# Patient Record
Sex: Female | Born: 1975 | Race: White | Hispanic: No | Marital: Single | Smoking: Never smoker
Health system: Southern US, Community
[De-identification: ages and names within clinical notes are randomized; demographics above are authoritative.]

## PROBLEM LIST (undated history)

## (undated) DIAGNOSIS — Z3491 Encounter for supervision of normal pregnancy, unspecified, first trimester: Secondary | ICD-10-CM

## (undated) DIAGNOSIS — R87629 Unspecified abnormal cytological findings in specimens from vagina: Secondary | ICD-10-CM

## (undated) HISTORY — DX: Unspecified abnormal cytological findings in specimens from vagina: R87.629

## (undated) HISTORY — DX: Encounter for supervision of normal pregnancy, unspecified, first trimester: Z34.91

---

## 2000-08-12 ENCOUNTER — Encounter: Payer: Self-pay | Admitting: *Deleted

## 2000-08-12 ENCOUNTER — Ambulatory Visit (HOSPITAL_COMMUNITY): Admission: RE | Admit: 2000-08-12 | Discharge: 2000-08-12 | Payer: Self-pay | Admitting: *Deleted

## 2001-01-01 ENCOUNTER — Inpatient Hospital Stay (HOSPITAL_COMMUNITY): Admission: RE | Admit: 2001-01-01 | Discharge: 2001-01-04 | Payer: Self-pay | Admitting: *Deleted

## 2002-01-03 ENCOUNTER — Emergency Department (HOSPITAL_COMMUNITY): Admission: EM | Admit: 2002-01-03 | Discharge: 2002-01-03 | Payer: Self-pay | Admitting: Emergency Medicine

## 2002-09-04 ENCOUNTER — Emergency Department (HOSPITAL_COMMUNITY): Admission: EM | Admit: 2002-09-04 | Discharge: 2002-09-04 | Payer: Self-pay

## 2006-09-07 ENCOUNTER — Ambulatory Visit: Payer: Self-pay | Admitting: Urgent Care

## 2010-01-27 ENCOUNTER — Emergency Department (HOSPITAL_COMMUNITY)
Admission: EM | Admit: 2010-01-27 | Discharge: 2010-01-27 | Payer: Self-pay | Source: Home / Self Care | Admitting: Emergency Medicine

## 2010-04-08 LAB — URINALYSIS, ROUTINE W REFLEX MICROSCOPIC
Bilirubin Urine: NEGATIVE
Glucose, UA: NEGATIVE mg/dL
Ketones, ur: NEGATIVE mg/dL
Nitrite: NEGATIVE
Protein, ur: NEGATIVE mg/dL
Specific Gravity, Urine: >1.03 — ABNORMAL HIGH (ref 1.005–1.030)
Urobilinogen, UA: 0.2 mg/dL (ref 0.0–1.0)
pH: 5.5 (ref 5.0–8.0)

## 2010-04-08 LAB — URINE MICROSCOPIC-ADD ON

## 2010-04-08 LAB — WET PREP, GENITAL: Yeast Wet Prep HPF POC: NONE SEEN

## 2010-04-08 LAB — URINE CULTURE
Colony Count: NO GROWTH
Culture  Setup Time: 201201012123
Culture: NO GROWTH

## 2010-04-08 LAB — GLUCOSE, CAPILLARY: Glucose-Capillary: 113 mg/dL — ABNORMAL HIGH (ref 70–99)

## 2010-04-08 LAB — GC/CHLAMYDIA PROBE AMP, GENITAL
Chlamydia, DNA Probe: NEGATIVE
GC Probe Amp, Genital: NEGATIVE

## 2010-04-08 LAB — PREGNANCY, URINE: Preg Test, Ur: NEGATIVE

## 2010-06-11 NOTE — Consult Note (Signed)
NAME:  Natasha Bender, Natasha Bender                  ACCOUNT NO.:  [id number]   MEDICAL RECORD NO.:  [id number]          PATIENT TYPE:  AMB   LOCATION:  DAY                           FACILITY:  [facility]   PHYSICIAN:  Kassie Mends, M.D.      DATE OF BIRTH:  1975/12/24   DATE OF CONSULTATION:  09/07/2006  DATE OF DISCHARGE:                                 CONSULTATION   REQUESTING PHYSICIAN:  Dr. Selinda Flavin.   REASON FOR CONSULTATION:  Rectal bleeding.   HPI:  Natasha Bender is a 35 year old female.  She tells me approximately 8  days ago she developed bright red rectal bleeding.  She said she first  noticed it in her panties and then with wiping, she also noticed small  amounts of clots.  She has noticed green stools for the last 3 weeks  that were somewhat soft but denies any outright diarrhea.  She is having  about 2 stools a day on some occasions and can go up to every other day  without a bowel movement.  She denies any abdominal pain, proctalgia,  pruritus, history of constipation or diarrhea.  She has been doing some  heavy lifting recently.  She was seen at the health department.  Her  hemoglobin was 14.6.  On exam she was grossly occult blood positive,  there was no evidence of external sources of bleeding.  She tells me she  has lost 14 pounds in the last 2 months, this has been mostly  intentional.  She denies any nausea, vomiting, heartburn, indigestion,  anorexia, dysphagia or odynophagia at this time.  She has rarely noticed  intermittent solid food dysphagia but only with certain foods, and this  has only occurred on a couple of occasions.   PAST MEDICAL AND SURGICAL HISTORY:  She has had a C-section.   CURRENT MEDICATIONS:  Tylenol p.r.n.   ALLERGIES:  No known drug allergies.   FAMILY HISTORY:  No known family history of colorectal carcinoma or  other chronic GI problems.   SOCIAL HISTORY:  1. Ms. Angelini is single.  2. She has a 63-year-old healthy son.  3. 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  4. She denies any tobacco, alcohol or drug use.   REVIEW OF SYSTEMS:  See HPI.   GYN:  Her last menstrual period was August 11, 2006.  She has a normal  cycle every 28 days.  Denies any problems.  Otherwise negative.   PHYSICAL EXAM:  VITAL SIGNS:  Weight 238.5 pounds, height 64 inches,  temp 98.2, blood pressure 120/80 and pulse 88.  GENERAL:  [name] is an obese Caucasian female who is alert, oriented,  pleasant, cooperative, in no acute distress.HEENT:  Sclera clear,  nonicteric.  Conjunctiva pink.  Oropharynx pink, moist without any  lesions.NECK:  Supple without any thyromegaly.CHEST/HEART:  Regular rate  and rhythm, normal S1 S2.LUNGS:  Clear to auscultation  bilaterally.ABDOMEN:  Positive bowel sounds x4.  No bruits auscultated.  Soft, nontender, nondistended.  Without palpable masses or  hepatosplenomegaly.  No rebound tenderness, no guarding.EXTREMITIES:  Without clubbing or edema bilaterally.SKIN:  Pink, warm and dry without  any rash or jaundice.   IMPRESSION:  [name] is a 35 year old female with just over a week long  history of rectal bleeding with clots.  Previous exam done through the  health department did not visualize a source of her bleeding, therefore  she is going to require further evaluation.  Differentials include  benign anal rectal bleeding including hemorrhoids, diverticular  bleeding, colonic polyps, or less likely colon cancer or inflammatory  bowel disease.   PLAN:  1. Check a urine pregnancy test.  2. Complete colonoscopy with Dr. Cira Servant in near future.  I discussed      the procedure including the risks and benefits including, but not      limited to, bleeding, infection, perforation and drug reaction.      She agrees, a signed consent will be obtained.   I would like to thank Dr. Dimas Aguas for allowing [place] to participate in the  care of Natasha Bender.      Lorenza Burton, N.P.      Kassie Mends,  M.D.  Electronically Signed    KJ/MEDQ  D:  09/07/2006  T:  09/07/2006  Job:  [id number]   cc:   Selinda Flavin  Fax: [phone]   Nadara Mustard, FNP  [place]hell [place], [place]

## 2010-06-14 NOTE — Discharge Summary (Signed)
[facility]  Patient:    Natasha Bender, Natasha Bender Visit Number: [id number] MRN: [id number]          Service Type: OBS Location: 4A A420 01 Attending Physician:  Jeri Cos. Dictated by:   Langley Gauss, M.D. Admit Date:  01/01/2001 Discharge Date: 01/04/2001                             Discharge Summary  PROCEDURES:  1.  January 01, 2001, induction of labor initially utilizing a Foley bulb      placed intracervically for mechanical ripening of cervix.  2.  January 01, 2001, severe variable decelerations due to nuchal cord with      compression.  3.  January 02, 2001, primary low transverse cesarean section.  DISPOSITION:  The patient is to follow up in the office in two to three days time for staple removal.  DISCHARGE MEDICATIONS:  1.  Tylox (#20) for pain relief.  2.  Continue with supple of prenatal vitamins.  The patient is bottle feeding at the time of discharge.  HOSPITAL COURSE:  See previous dictations.  The patient underwent primary low transverse cesarean section on January 02, 2001.  She had a Foley catheter and JP drain within the subcutaneous space.  The patient remained afebrile through the hospital course.  She advanced her diet such that at the time of discharge she was tolerating a regular general diet and, more pertinently, tolerating p.o. intake of pain medication.  She has likewise resumed regular diet, so the patient is discharged home on January 04, 2001. Dictated by:   Langley Gauss, M.D. Attending Physician:  Jeri Cos. DD:  01/04/01 TD:  01/05/01 Job: [id number]

## 2010-06-14 NOTE — Op Note (Signed)
[facility]  Patient:    Natasha Bender, Natasha Bender Visit Number: [id number] MRN: [id number]          Service Type: OBS Location: 4A A420 01 Attending Physician:  Jeri Cos. Dictated by:   Langley Gauss, M.D. Proc. Date: 12/31/00 Admit Date:  01/01/2001 Discharge Date: 01/04/2001                             Operative Report  PROCEDURE:  Placement of continuous lumbar epidural analgesia.  GYNECOLOGIST:  Langley Gauss, M.D.  DESCRIPTION OF PROCEDURE:  With patient in seated position, bony landmarks are identified.  L3-L4 interspace is sterilely prepped and draped in the usual manner.  Five cc of 1% lidocaine were injected at the site, a 17-gauge Touhy-Schiff needle then utilized with loss of resistance in air-filled glass syringe to identify entry into the epidural space on the third attempt without difficulty, excellent loss of resistance noted.  Initial test dose of 5 cc of 1.5% lidocaine plus epinephrine was injected through the needle, catheter then inserted to a depth of 4 cm and needle was removed.  Aspiration test is negative.  Second test dose of 2 cc of 1.5% lidocaine plus epinephrine is injected through the epidural catheter.  No signs of CSF or intravascular injection obtained.  Catheter is secured into place.  Patient is connected to the infusion pump containing a standard mixture.  She will be treated with a 10 cc bolus, followed by an infusion rate of 14 cc/hr.  Following this, there was noted to be evidence of an excellent bilateral block setting up. Dictated by:   Langley Gauss, M.D. Attending Physician:  Jeri Cos. DD:  01/06/01 TD:  01/06/01 Job: [id number]

## 2011-11-18 ENCOUNTER — Other Ambulatory Visit (HOSPITAL_COMMUNITY)
Admission: RE | Admit: 2011-11-18 | Discharge: 2011-11-18 | Disposition: A | Discharge status: Home / Self Care | Payer: Self-pay | Source: Ambulatory Visit | Attending: Unknown Physician Specialty | Admitting: Unknown Physician Specialty

## 2011-11-18 DIAGNOSIS — R87612 Low grade squamous intraepithelial lesion on cytologic smear of cervix (LGSIL): Secondary | ICD-10-CM | POA: Insufficient documentation

## 2011-11-18 DIAGNOSIS — N870 Mild cervical dysplasia: Secondary | ICD-10-CM | POA: Insufficient documentation

## 2013-02-25 ENCOUNTER — Encounter: Payer: Self-pay | Admitting: Obstetrics & Gynecology

## 2013-02-25 ENCOUNTER — Encounter (INDEPENDENT_AMBULATORY_CARE_PROVIDER_SITE_OTHER): Payer: Self-pay

## 2013-02-25 ENCOUNTER — Ambulatory Visit (INDEPENDENT_AMBULATORY_CARE_PROVIDER_SITE_OTHER): Payer: Self-pay | Admitting: Obstetrics & Gynecology

## 2013-02-25 VITALS — BP 148/80 | Ht 63.0 in | Wt 266.0 lb

## 2013-02-25 DIAGNOSIS — Z3201 Encounter for pregnancy test, result positive: Secondary | ICD-10-CM

## 2013-02-25 LAB — POCT URINE PREGNANCY: Preg Test, Ur: POSITIVE

## 2013-02-25 NOTE — Progress Notes (Signed)
Pt here for pregnancy test. Positive result. Pt reports some cramping off and on but no spotting. Advised cramping and spotting can be normal in early pregnancy. If cramping gets worse or if spotting starts, call office. Pt voiced understanding. JSY

## 2013-03-08 ENCOUNTER — Other Ambulatory Visit: Payer: Self-pay | Admitting: Obstetrics & Gynecology

## 2013-03-08 DIAGNOSIS — O3680X0 Pregnancy with inconclusive fetal viability, not applicable or unspecified: Secondary | ICD-10-CM

## 2013-03-11 ENCOUNTER — Ambulatory Visit (INDEPENDENT_AMBULATORY_CARE_PROVIDER_SITE_OTHER): Payer: Medicaid Other

## 2013-03-11 ENCOUNTER — Encounter: Payer: Self-pay | Admitting: Obstetrics & Gynecology

## 2013-03-11 ENCOUNTER — Other Ambulatory Visit: Payer: Self-pay | Admitting: Obstetrics & Gynecology

## 2013-03-11 DIAGNOSIS — O34219 Maternal care for unspecified type scar from previous cesarean delivery: Secondary | ICD-10-CM

## 2013-03-11 DIAGNOSIS — O3680X0 Pregnancy with inconclusive fetal viability, not applicable or unspecified: Secondary | ICD-10-CM

## 2013-03-11 DIAGNOSIS — O09529 Supervision of elderly multigravida, unspecified trimester: Secondary | ICD-10-CM

## 2013-03-11 NOTE — Progress Notes (Signed)
U/S-IUP with +YS and fetal pole noted with +FCA noted, FHR-109 bpm, cx appears closed, bilateral adnexa wnl, ?hemorrhage noted adjacent to GS vs. Second GS noted, would like to repeat for confirmation

## 2013-03-18 ENCOUNTER — Ambulatory Visit (INDEPENDENT_AMBULATORY_CARE_PROVIDER_SITE_OTHER): Payer: Medicaid Other | Admitting: Adult Health

## 2013-03-18 ENCOUNTER — Encounter: Payer: Self-pay | Admitting: Adult Health

## 2013-03-18 ENCOUNTER — Other Ambulatory Visit (HOSPITAL_COMMUNITY)
Admission: RE | Admit: 2013-03-18 | Discharge: 2013-03-18 | Disposition: A | Discharge status: Home / Self Care | Payer: Medicaid Other | Source: Ambulatory Visit | Attending: Adult Health | Admitting: Adult Health

## 2013-03-18 VITALS — BP 118/76 | Wt 264.0 lb

## 2013-03-18 DIAGNOSIS — Z01419 Encounter for gynecological examination (general) (routine) without abnormal findings: Secondary | ICD-10-CM | POA: Insufficient documentation

## 2013-03-18 DIAGNOSIS — Z113 Encounter for screening for infections with a predominantly sexual mode of transmission: Secondary | ICD-10-CM | POA: Insufficient documentation

## 2013-03-18 DIAGNOSIS — Z1151 Encounter for screening for human papillomavirus (HPV): Secondary | ICD-10-CM | POA: Insufficient documentation

## 2013-03-18 DIAGNOSIS — O9989 Other specified diseases and conditions complicating pregnancy, childbirth and the puerperium: Secondary | ICD-10-CM

## 2013-03-18 DIAGNOSIS — Z1389 Encounter for screening for other disorder: Secondary | ICD-10-CM

## 2013-03-18 DIAGNOSIS — Z3491 Encounter for supervision of normal pregnancy, unspecified, first trimester: Secondary | ICD-10-CM

## 2013-03-18 DIAGNOSIS — Z331 Pregnant state, incidental: Secondary | ICD-10-CM

## 2013-03-18 DIAGNOSIS — Z3480 Encounter for supervision of other normal pregnancy, unspecified trimester: Secondary | ICD-10-CM

## 2013-03-18 DIAGNOSIS — Z3490 Encounter for supervision of normal pregnancy, unspecified, unspecified trimester: Secondary | ICD-10-CM

## 2013-03-18 HISTORY — DX: Encounter for supervision of normal pregnancy, unspecified, first trimester: Z34.91

## 2013-03-18 LAB — CBC
HCT: 39.4 % (ref 36.0–46.0)
Hemoglobin: 13.6 g/dL (ref 12.0–15.0)
MCH: 28.1 pg (ref 26.0–34.0)
MCHC: 34.5 g/dL (ref 30.0–36.0)
MCV: 81.4 fL (ref 78.0–100.0)
Platelets: 242 10*3/uL (ref 150–400)
RBC: 4.84 MIL/uL (ref 3.87–5.11)
RDW: 14.8 % (ref 11.5–15.5)
WBC: 6.6 10*3/uL (ref 4.0–10.5)

## 2013-03-18 LAB — POCT URINALYSIS DIPSTICK
Blood, UA: NEGATIVE
Glucose, UA: NEGATIVE
Ketones, UA: NEGATIVE
Leukocytes, UA: NEGATIVE
Nitrite, UA: NEGATIVE

## 2013-03-18 LAB — HEPATITIS B SURFACE ANTIGEN: Hepatitis B Surface Ag: NEGATIVE

## 2013-03-18 LAB — TSH: TSH: 4.109 u[IU]/mL (ref 0.350–4.500)

## 2013-03-18 LAB — HIV ANTIBODY (ROUTINE TESTING W REFLEX): HIV: NONREACTIVE

## 2013-03-18 NOTE — Progress Notes (Signed)
Subjective:  Natasha Bender is a 38 y.o. 12P1001 Caucasian female at 6965w3d by LMP being seen today for her first obstetrical visit.  Her obstetrical history is significant for obesity.  Pregnancy history fully reviewed.  Patient reports no complaints. Denies vb, cramping, uti s/s, abnormal/malodorous vag d/c, or vulvovaginal itching/irritation.  BP 118/76  Wt 264 lb (119.75 kg)  LMP 01/25/2013  HISTORY: OB History  Gravida Para Term Preterm AB SAB TAB Ectopic Multiple Living  2 1 1       1     # Outcome Date GA Lbr Len/2nd Weight Sex Delivery Anes PTL Lv  2 CUR           1 TRM 01/02/01 1871w0d  5 lb 7 oz (2.466 kg) M LTCS EPI  Y     Past Medical History  Diagnosis Date  . Vaginal Pap smear, abnormal   . Supervision of normal pregnancy in first trimester 03/18/2013   Past Surgical History  Procedure Laterality Date  . Cesarean section  2002   History reviewed. No pertinent family history.  Exam   System:     General: Well developed & nourished, no acute distress   Skin: Warm & dry, normal coloration and turgor, no rashes   Neurologic: Alert & oriented, normal mood   Cardiovascular: Regular rate & rhythm   Respiratory: Effort & rate normal, LCTAB, acyanotic   Abdomen: Soft, non tender   Extremities: normal strength, tone   Pelvic Exam:    Perineum: Normal perineum   Vulva: Normal, no lesions   Vagina:  Normal mucosa, normal discharge   Cervix: Normal, bulbous, appears closed   Uterus: Normal size/shape/contour for GA   Thin prep pap smear with HPV GC/CHL FHR: saw FHM could not count, size <than dates and 2 sacs, only 1 fetal pole seen on US   Assessment:   Pregnancy: G2P1001 Patient Active Problem List   Diagnosis Date Noted  . Supervision of normal pregnancy in first trimester 03/18/2013    8965w3d G2P1001 New OB visit     Plan:  Initial labs drawn Continue prenatal vitamins Problem list reviewed and updated Reviewed n/v relief measures and warning s/s to  report Reviewed recommended weight gain based on pre-gravid BMI Encouraged well-balanced diet Genetic Screening discussed Integrated Screen: requested Cystic fibrosis screening discussed requested Ultrasound discussed; fetal survey: requested Follow up in 1 weeks for US and see me  Discussed that could be 2 sacs and that size<dates so will get US in 1 week and check QHCG and progesterone  Adline PotterJennifer A. Griffin, NP 03/18/2013 12:15 PM

## 2013-03-18 NOTE — Progress Notes (Signed)
Pt states that she is having a stabbing pain on her right side at her rib cage. Pt given CCNC form and lab consent to read over and sign.

## 2013-03-18 NOTE — Patient Instructions (Signed)

## 2013-03-19 LAB — DRUG SCREEN, URINE, NO CONFIRMATION
Amphetamine Screen, Ur: NEGATIVE
Barbiturate Quant, Ur: NEGATIVE
Benzodiazepines.: NEGATIVE
Cocaine Metabolites: NEGATIVE
Creatinine,U: 260.5 mg/dL
Marijuana Metabolite: NEGATIVE
Methadone: NEGATIVE
Opiate Screen, Urine: NEGATIVE
Phencyclidine (PCP): NEGATIVE
Propoxyphene: NEGATIVE

## 2013-03-19 LAB — VARICELLA ZOSTER ANTIBODY, IGG: Varicella IgG: >4000 Index — ABNORMAL HIGH (ref <135.00)

## 2013-03-19 LAB — URINE CULTURE
Colony Count: NO GROWTH
Organism ID, Bacteria: NO GROWTH

## 2013-03-19 LAB — URINALYSIS
Bilirubin Urine: NEGATIVE
Glucose, UA: NEGATIVE mg/dL
Hgb urine dipstick: NEGATIVE
Ketones, ur: NEGATIVE mg/dL
Leukocytes, UA: NEGATIVE
Nitrite: NEGATIVE
Protein, ur: 30 mg/dL — AB
Specific Gravity, Urine: 1.026 (ref 1.005–1.030)
Urobilinogen, UA: 0.2 mg/dL (ref 0.0–1.0)
pH: 5.5 (ref 5.0–8.0)

## 2013-03-19 LAB — ABO AND RH: Rh Type: POSITIVE

## 2013-03-19 LAB — PROGESTERONE: Progesterone: 16 ng/mL

## 2013-03-19 LAB — OXYCODONE SCREEN, UA, RFLX CONFIRM: Oxycodone Screen, Ur: NEGATIVE ng/mL

## 2013-03-19 LAB — RPR

## 2013-03-19 LAB — RUBELLA SCREEN: Rubella: 1.64 Index — ABNORMAL HIGH (ref <0.90)

## 2013-03-19 LAB — ANTIBODY SCREEN: Antibody Screen: NEGATIVE

## 2013-03-19 LAB — HCG, QUANTITATIVE, PREGNANCY: hCG, Beta Chain, Quant, S: 44287.5 m[IU]/mL

## 2013-03-22 LAB — CYSTIC FIBROSIS DIAGNOSTIC STUDY

## 2013-03-23 ENCOUNTER — Encounter: Payer: Self-pay | Admitting: Adult Health

## 2013-03-24 ENCOUNTER — Other Ambulatory Visit: Payer: Self-pay

## 2013-03-24 ENCOUNTER — Encounter: Payer: Self-pay | Admitting: Obstetrics & Gynecology

## 2013-03-25 ENCOUNTER — Other Ambulatory Visit: Payer: Self-pay | Admitting: Obstetrics & Gynecology

## 2013-03-25 DIAGNOSIS — O3680X0 Pregnancy with inconclusive fetal viability, not applicable or unspecified: Secondary | ICD-10-CM

## 2013-03-28 ENCOUNTER — Ambulatory Visit (INDEPENDENT_AMBULATORY_CARE_PROVIDER_SITE_OTHER): Payer: Medicaid Other | Admitting: Obstetrics & Gynecology

## 2013-03-28 ENCOUNTER — Encounter: Payer: Self-pay | Admitting: Obstetrics & Gynecology

## 2013-03-28 ENCOUNTER — Ambulatory Visit (INDEPENDENT_AMBULATORY_CARE_PROVIDER_SITE_OTHER): Payer: Medicaid Other

## 2013-03-28 ENCOUNTER — Encounter (INDEPENDENT_AMBULATORY_CARE_PROVIDER_SITE_OTHER): Payer: Self-pay

## 2013-03-28 ENCOUNTER — Other Ambulatory Visit: Payer: Self-pay | Admitting: Obstetrics & Gynecology

## 2013-03-28 VITALS — BP 130/80 | Wt 264.0 lb

## 2013-03-28 DIAGNOSIS — Z1389 Encounter for screening for other disorder: Secondary | ICD-10-CM

## 2013-03-28 DIAGNOSIS — O021 Missed abortion: Secondary | ICD-10-CM

## 2013-03-28 DIAGNOSIS — Z331 Pregnant state, incidental: Secondary | ICD-10-CM

## 2013-03-28 DIAGNOSIS — O3680X0 Pregnancy with inconclusive fetal viability, not applicable or unspecified: Secondary | ICD-10-CM

## 2013-03-28 LAB — POCT URINALYSIS DIPSTICK
Blood, UA: NEGATIVE
GLUCOSE UA: NEGATIVE
Ketones, UA: NEGATIVE
Nitrite, UA: NEGATIVE
Protein, UA: 1

## 2013-03-28 MED ORDER — MISOPROSTOL 200 MCG PO TABS: ORAL_TABLET | ORAL | Status: AC

## 2013-03-28 MED ORDER — PRENATAL VITAMINS 0.8 MG PO TABS: 1.0000 | ORAL_TABLET | Freq: Every day | ORAL | Status: AC

## 2013-03-28 NOTE — Addendum Note (Signed)
Addended by: Criss AlvinePULLIAM, CHRYSTAL G on: 03/28/2013 11:17 AM   Modules accepted: Orders

## 2013-03-28 NOTE — Progress Notes (Signed)
U/S-transvaginal u/s performed, IUP with no FCA noted on today's exam, CRL c/w 6+2wks(6.363mm), cx appears closed, 2 sacs noted although only 1 with +YS and fetal pole noted within, bilateral adnexa appears wnl

## 2013-03-28 NOTE — Progress Notes (Signed)
See sonogram report, non viable pregnancy in the first trimester Discussed in detail with the patient  Pt wants to consider cytotec and will take at home as she decides  Follow up 2 weeks after physical pregnancy loss Recommend avoid pregnancy for 3 cycles

## 2013-04-11 ENCOUNTER — Ambulatory Visit: Payer: Self-pay | Admitting: Obstetrics & Gynecology

## 2013-04-15 ENCOUNTER — Encounter: Payer: Self-pay | Admitting: Obstetrics & Gynecology

## 2013-04-15 ENCOUNTER — Ambulatory Visit (INDEPENDENT_AMBULATORY_CARE_PROVIDER_SITE_OTHER): Payer: Medicaid Other | Admitting: Obstetrics & Gynecology

## 2013-04-15 VITALS — BP 130/80 | Ht 63.0 in | Wt 262.0 lb

## 2013-04-15 DIAGNOSIS — O021 Missed abortion: Secondary | ICD-10-CM

## 2013-04-15 DIAGNOSIS — Z3202 Encounter for pregnancy test, result negative: Secondary | ICD-10-CM

## 2013-04-15 LAB — POCT URINE PREGNANCY: PREG TEST UR: NEGATIVE

## 2013-04-15 MED ORDER — DESOGESTREL-ETHINYL ESTRADIOL 0.15-30 MG-MCG PO TABS: 1.0000 | ORAL_TABLET | Freq: Every day | ORAL | Status: AC

## 2013-04-15 NOTE — Addendum Note (Signed)
Addended by: Colen DarlingYOUNG, Elyzabeth Goatley S on: 04/15/2013 10:41 AM   Modules accepted: Orders

## 2013-04-15 NOTE — Progress Notes (Signed)
   Subjective:    Patient ID: Natasha Bender, female    DOB: [birth year], 38 y.o.   MRN: [id number]  HPI 2 weeks post pregnancy loss for non viable pregnancy Used cytotec Bled for about 12 days None now    Review of Systems    Negative Objective:   Physical Exam  Normal involution non tender      Assessment & Plan:  Completed early SAB No conception for 3 months Follow up prn

## 2013-11-28 ENCOUNTER — Encounter: Payer: Self-pay | Admitting: Obstetrics & Gynecology

## 2014-01-14 ENCOUNTER — Encounter (HOSPITAL_COMMUNITY): Payer: Self-pay | Admitting: *Deleted

## 2014-03-23 ENCOUNTER — Ambulatory Visit: Payer: Medicaid Other | Admitting: Advanced Practice Midwife

## 2015-10-18 ENCOUNTER — Other Ambulatory Visit (HOSPITAL_COMMUNITY): Payer: Self-pay | Admitting: Nurse Practitioner

## 2015-10-18 DIAGNOSIS — Z139 Encounter for screening, unspecified: Secondary | ICD-10-CM

## 2015-11-02 ENCOUNTER — Encounter (HOSPITAL_COMMUNITY): Payer: Self-pay | Admitting: Radiology

## 2015-11-02 ENCOUNTER — Ambulatory Visit (HOSPITAL_COMMUNITY)
Admission: RE | Admit: 2015-11-02 | Discharge: 2015-11-02 | Disposition: A | Discharge status: Home / Self Care | Payer: Medicaid Other | Source: Ambulatory Visit | Attending: Nurse Practitioner | Admitting: Nurse Practitioner

## 2015-11-02 DIAGNOSIS — Z139 Encounter for screening, unspecified: Secondary | ICD-10-CM

## 2015-12-03 ENCOUNTER — Encounter: Payer: Self-pay | Admitting: Gastroenterology

## 2015-12-26 ENCOUNTER — Encounter: Payer: Self-pay | Admitting: Gastroenterology

## 2015-12-26 ENCOUNTER — Telehealth: Payer: Self-pay | Admitting: Gastroenterology

## 2015-12-26 ENCOUNTER — Ambulatory Visit: Payer: Medicaid Other | Admitting: Gastroenterology

## 2015-12-26 NOTE — Telephone Encounter (Signed)
PATIENT WAS A NO SHOW AND LETTER SENT  °

## 2016-05-09 ENCOUNTER — Emergency Department (HOSPITAL_COMMUNITY): Payer: Self-pay

## 2016-05-09 ENCOUNTER — Encounter (HOSPITAL_COMMUNITY): Payer: Self-pay | Admitting: *Deleted

## 2016-05-09 ENCOUNTER — Emergency Department (HOSPITAL_COMMUNITY)
Admission: EM | Admit: 2016-05-09 | Discharge: 2016-05-10 | Disposition: A | Discharge status: Home / Self Care | Payer: Self-pay | Attending: Emergency Medicine | Admitting: Emergency Medicine

## 2016-05-09 DIAGNOSIS — Z79899 Other long term (current) drug therapy: Secondary | ICD-10-CM | POA: Insufficient documentation

## 2016-05-09 DIAGNOSIS — Z5181 Encounter for therapeutic drug level monitoring: Secondary | ICD-10-CM | POA: Insufficient documentation

## 2016-05-09 DIAGNOSIS — G510 Bell's palsy: Secondary | ICD-10-CM | POA: Insufficient documentation

## 2016-05-09 MED ORDER — METOCLOPRAMIDE HCL 5 MG/ML IJ SOLN
10.0000 mg | Freq: Once | INTRAMUSCULAR | Status: AC
  Administered 2016-05-09: 10 mg via INTRAVENOUS
  Filled 2016-05-09: qty 2

## 2016-05-09 MED ORDER — DIPHENHYDRAMINE HCL 50 MG/ML IJ SOLN
25.0000 mg | Freq: Once | INTRAMUSCULAR | Status: AC
  Administered 2016-05-09: 25 mg via INTRAVENOUS
  Filled 2016-05-09: qty 1

## 2016-05-09 MED ORDER — KETOROLAC TROMETHAMINE 30 MG/ML IJ SOLN
30.0000 mg | Freq: Once | INTRAMUSCULAR | Status: AC
  Administered 2016-05-09: 30 mg via INTRAVENOUS
  Filled 2016-05-09: qty 1

## 2016-05-09 NOTE — ED Triage Notes (Signed)
Pt also reports headache and difficulty swallowing.

## 2016-05-09 NOTE — ED Provider Notes (Signed)
[facility]-EMERGENCY DEPT Provider Note   CSN: [id number] Arrival date & time: 05/09/16  2249   By signing my name below, I, Clarisse Gouge, attest that this documentation has been prepared under the direction and in the presence of Glynn Octave, MD. Electronically signed, Clarisse Gouge, ED Scribe. 05/09/16. 11:29 PM.  History   Chief Complaint Chief Complaint  Patient presents with  . facial numbness   The history is provided by the patient and medical records. No language interpreter was used.    Natasha Bender is a 41 y.o. female with no pertinent PMHx, transported via family to the Emergency Department with concern for R > L facial numbness onset ~ 5PM today. Pt notes associated diaphoresis and bilateral calf pain last night, lingering bilateral calf pain, difficulty swallowing and talking d/t "funny feeling" in the tongue, gradual intermittent headache since yesterday morning on waking mildly relieved with aleve, and R sided difficulty tasting. States she couldn't blow up balloons recently. States she had shrimp for lunch ~ 1:30-2:30 PM today and noticed current symptoms soon after. No shrimp allergies noted; states she does not taste anything currently and could not taste the shrimp she ate at lunch. Pt on pill based birth control. Denies SOB, weakness, abdominal pain, cough wheezes, chest pain, and h/o DM, HTN.   Past Medical History:  Diagnosis Date  . Supervision of normal pregnancy in first trimester 03/18/2013  . Vaginal Pap smear, abnormal     Patient Active Problem List   Diagnosis Date Noted  . Missed abortion 03/28/2013  . Supervision of normal pregnancy in first trimester 03/18/2013    Past Surgical History:  Procedure Laterality Date  . CESAREAN SECTION  2002    OB History    Gravida Para Term Preterm AB Living   SAB TAB Ectopic Multiple Live Births           1       Home Medications    Prior to Admission medications   Medication Sig Start  Date End Date Taking? Authorizing Provider  desogestrel-ethinyl estradiol (APRI,EMOQUETTE,SOLIA) 0.15-30 MG-MCG tablet Take 1 tablet by mouth daily. 04/15/13   Lazaro Arms, MD  misoprostol (CYTOTEC) 200 MCG tablet Take 4 tablets at once 03/28/13   Lazaro Arms, MD  Prenat-FeCbn-FeAspGl-FA-Omega (OB COMPLETE PETITE PO) Take by mouth daily.    Historical Provider, MD  Prenatal Multivit-Min-Fe-FA (PRENATAL VITAMINS) 0.8 MG tablet Take 1 tablet by mouth daily. 03/28/13   Lazaro Arms, MD    Family History History reviewed. No pertinent family history.  Social History Social History  Substance Use Topics  . Smoking status: Never Smoker  . Smokeless tobacco: Never Used  . Alcohol use Yes     Comment: occasionally     Allergies   Patient has no known allergies.   Review of Systems Review of Systems All other systems reviewed and all systems are negative for acute changes except as noted in the HPI and PMH.    Physical Exam Updated Vital Signs BP (!) 142/94 (BP Location: Left Arm)   Pulse (!) 102   Temp 97.8 F (36.6 C) (Oral)   Resp 18   Ht  (1.6 m)   Wt 253 lb (114.8 kg)   SpO2 99%   BMI 44.82 kg/m   Physical Exam  Constitutional: She is oriented to person, place, and time. She appears well-developed and well-nourished. No distress.  HENT:  Head: Normocephalic  and atraumatic.  Mouth/Throat: Oropharynx is clear and moist. No oropharyngeal exudate.  Eyes: Conjunctivae and EOM are normal. Pupils are equal, round, and reactive to light.  Neck: Normal range of motion. Neck supple.  No meningismus.  Cardiovascular: Normal rate, regular rhythm, normal heart sounds and intact distal pulses.   No murmur heard. Pulmonary/Chest: Effort normal and breath sounds normal. No respiratory distress.  Abdominal: Soft. There is no tenderness. There is no rebound and no guarding.  Musculoskeletal: Normal range of motion. She exhibits no edema or tenderness.  Neurological: She is alert  and oriented to person, place, and time. A cranial nerve deficit is present. She exhibits normal muscle tone. Coordination normal.   5/5 strength throughout.Equal grip strength. No pronator drift, no ataxia on finger to nose.  Flattening of the R nasal labial fold, asymmetric smile with R facial droop, difficulty closing R eye with weakness and some difficulty with eyebrow raise, decreased sensation to R face.  Skin: Skin is warm.  Psychiatric: She has a normal mood and affect. Her behavior is normal.  Nursing note and vitals reviewed.    ED Treatments / Results  DIAGNOSTIC STUDIES: Oxygen Saturation is 99% on RA, NL by my interpretation.    COORDINATION OF CARE: 11:28 PM Discussed treatment plan with pt at bedside and pt agreed to plan. Will order imaging and medications.  Labs (all labs ordered are listed, but only abnormal results are displayed) Labs Reviewed - No data to display  EKG  EKG Interpretation None       Radiology No results found.  Procedures Procedures (including critical care time)  Medications Ordered in ED Medications - No data to display   Initial Impression / Assessment and Plan / ED Course  I have reviewed the triage vital signs and the nursing notes.  Pertinent labs & imaging results that were available during my care of the patient were reviewed by me and considered in my medical decision making (see chart for details).     Patient presents with facial asymmetry, difficulty with taste, numbness to her face onset around 1 PM this afternoon. No focal weakness in arms or legs. Also associated with headache that is being on and off for the past several days.  Patient with facial droop on exam. Tongue midline. Difficulty closing her right eye and weakness with right eyebrow raise.  Suspect Bell's palsy. Doubts stroke. CT head obtained and is negative. Complex migraine considered as well but no improvement with migraine cocktail. D/w teleneurology Dr.  Laurell Josephs who agrees.  We'll treat with antivirals, prednisone, eye lubrication. Follow-up with PCP. Return consciousness discussed.  Final Clinical Impressions(s) / ED Diagnoses   Final diagnoses:  Bell's palsy    New Prescriptions New Prescriptions   No medications on file  I personally performed the services described in this documentation, which was scribed in my presence. The recorded information has been reviewed and is accurate.    Glynn Octave, MD 05/10/16 [phone]

## 2016-05-09 NOTE — ED Triage Notes (Signed)
Pt reports facial numbness since last night- reports r sided numbness since this morning.  Pt speaking on her cell phone brought to small waiting room behind triage - pt ambulates heel to toe with purpose, without stagger or drift and has no deficits

## 2016-05-10 LAB — COMPREHENSIVE METABOLIC PANEL
ALT: 29 U/L (ref 14–54)
AST: 32 U/L (ref 15–41)
Albumin: 3.9 g/dL (ref 3.5–5.0)
Alkaline Phosphatase: 46 U/L (ref 38–126)
Anion gap: 10 (ref 5–15)
BILIRUBIN TOTAL: 0.6 mg/dL (ref 0.3–1.2)
BUN: 24 mg/dL — ABNORMAL HIGH (ref 6–20)
CHLORIDE: 103 mmol/L (ref 101–111)
CO2: 22 mmol/L (ref 22–32)
Calcium: 9.3 mg/dL (ref 8.9–10.3)
Creatinine, Ser: 0.8 mg/dL (ref 0.44–1.00)
Glucose, Bld: 99 mg/dL (ref 65–99)
POTASSIUM: 3.7 mmol/L (ref 3.5–5.1)
Sodium: 135 mmol/L (ref 135–145)
TOTAL PROTEIN: 8.2 g/dL — AB (ref 6.5–8.1)

## 2016-05-10 LAB — CBC
HEMATOCRIT: 41.6 % (ref 36.0–46.0)
Hemoglobin: 14.3 g/dL (ref 12.0–15.0)
MCH: 29.1 pg (ref 26.0–34.0)
MCHC: 34.4 g/dL (ref 30.0–36.0)
MCV: 84.6 fL (ref 78.0–100.0)
Platelets: 219 10*3/uL (ref 150–400)
RBC: 4.92 MIL/uL (ref 3.87–5.11)
RDW: 13.3 % (ref 11.5–15.5)
WBC: 6.7 10*3/uL (ref 4.0–10.5)

## 2016-05-10 LAB — DIFFERENTIAL
Basophils Absolute: 0 10*3/uL (ref 0.0–0.1)
Basophils Relative: 0 %
EOS ABS: 0.2 10*3/uL (ref 0.0–0.7)
EOS PCT: 3 %
LYMPHS ABS: 1.9 10*3/uL (ref 0.7–4.0)
Lymphocytes Relative: 28 %
MONO ABS: 0.3 10*3/uL (ref 0.1–1.0)
MONOS PCT: 5 %
Neutro Abs: 4.3 10*3/uL (ref 1.7–7.7)
Neutrophils Relative %: 64 %

## 2016-05-10 LAB — PROTIME-INR
INR: 1.09
Prothrombin Time: 14.1 seconds (ref 11.4–15.2)

## 2016-05-10 LAB — ETHANOL

## 2016-05-10 LAB — APTT: APTT: 28 s (ref 24–36)

## 2016-05-10 MED ORDER — VALACYCLOVIR HCL 1 G PO TABS: 1000.0000 mg | ORAL_TABLET | Freq: Three times a day (TID) | ORAL | 0 refills | Status: AC

## 2016-05-10 MED ORDER — PREDNISONE 50 MG PO TABS: ORAL_TABLET | ORAL | 0 refills | Status: AC

## 2016-05-10 MED ORDER — PREDNISONE 50 MG PO TABS
60.0000 mg | ORAL_TABLET | Freq: Once | ORAL | Status: AC
  Administered 2016-05-10: 04:00:00 60 mg via ORAL
  Filled 2016-05-10: qty 1

## 2016-05-10 MED ORDER — VALACYCLOVIR HCL 500 MG PO TABS
1000.0000 mg | ORAL_TABLET | Freq: Once | ORAL | Status: AC
  Administered 2016-05-10: 1000 mg via ORAL
  Filled 2016-05-10: qty 2

## 2016-05-10 MED ORDER — ARTIFICIAL TEARS OP OINT: TOPICAL_OINTMENT | Freq: Every evening | OPHTHALMIC | 0 refills | Status: AC | PRN

## 2016-05-10 NOTE — ED Notes (Signed)
Tele Neuro at this time.

## 2016-05-10 NOTE — ED Notes (Signed)
Pt alert & oriented x4, stable gait. Patient given discharge instructions, paperwork & prescription(s). Patient  instructed to stop at the registration desk to finish any additional paperwork. Patient verbalized understanding. Pt left department w/ no further questions. 

## 2016-05-10 NOTE — Discharge Instructions (Signed)
Follow up with your primary doctor and the neurologist. Return to the ED if you develop new or worsening symptoms.

## 2016-05-10 NOTE — ED Notes (Signed)
Patient has difficulty swallowing cracker for stroke swallow screen, no issues with liquid

## 2017-03-03 ENCOUNTER — Ambulatory Visit (HOSPITAL_COMMUNITY): Admission: EM | Admit: 2017-03-03 | Discharge: 2017-03-03 | Discharge status: Against Medical Advice | Payer: Self-pay

## 2017-03-03 NOTE — ED Triage Notes (Signed)
Per pt access, pt got appt with PCP and left the building

## 2017-10-20 IMAGING — CT CT HEAD W/O CM
3 series · 15 of 46 positions shown, 18 images · non-contrast
Comparison: None.

CLINICAL DATA: Acute onset of right-sided facial numbness. Headache
and difficulty swallowing. Initial encounter.

EXAM:
CT HEAD WITHOUT CONTRAST
TECHNIQUE: Contiguous axial images were obtained from the base of the skull
through the vertex without intravenous contrast.

[Series 2: head wo · axial · 0.46mm/px · z∈[+1285,+1405]mm · 9 of 29 slices shown, 12 images]
[im 3/29  brain]
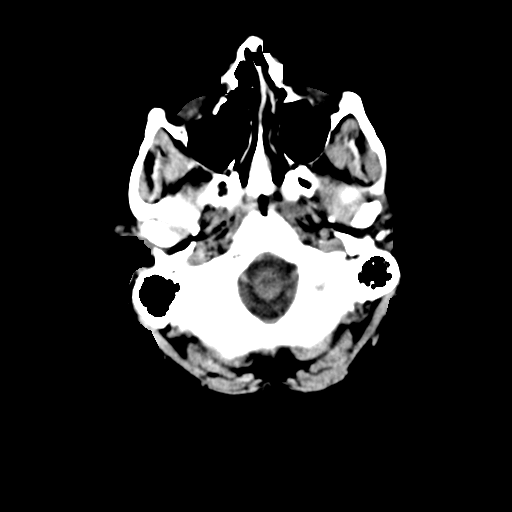
[im 3/29  bone]
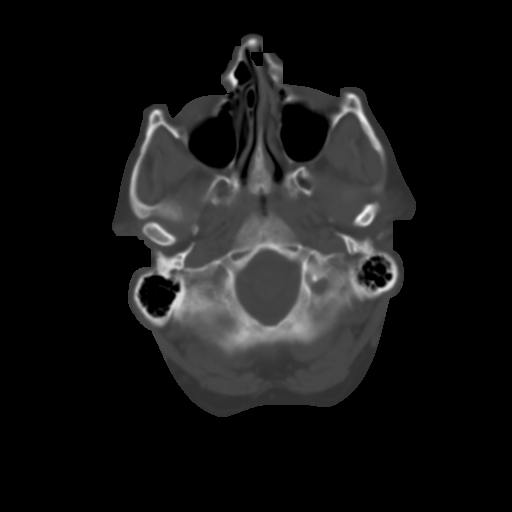
[im 6/29  brain]
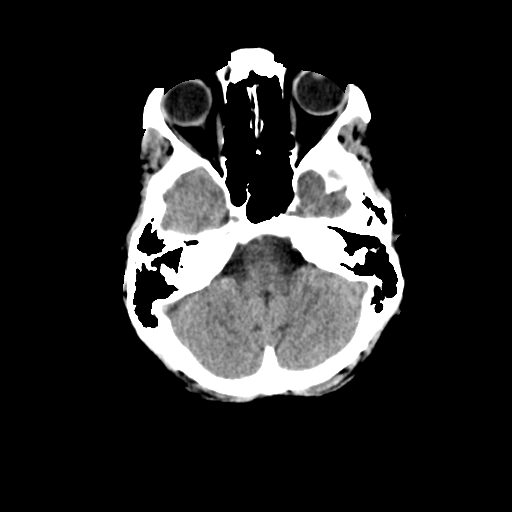
[im 9/29  brain]
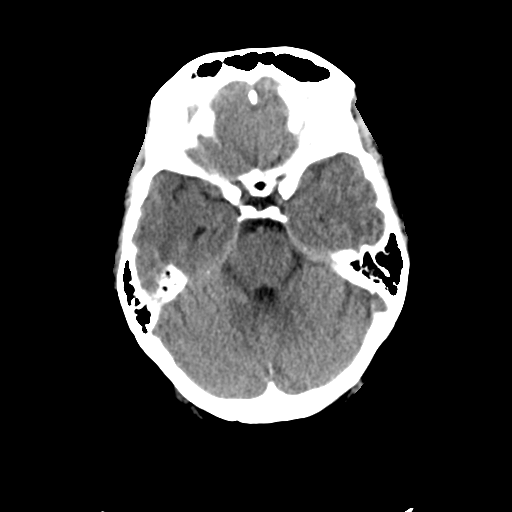
[im 12/29  brain]
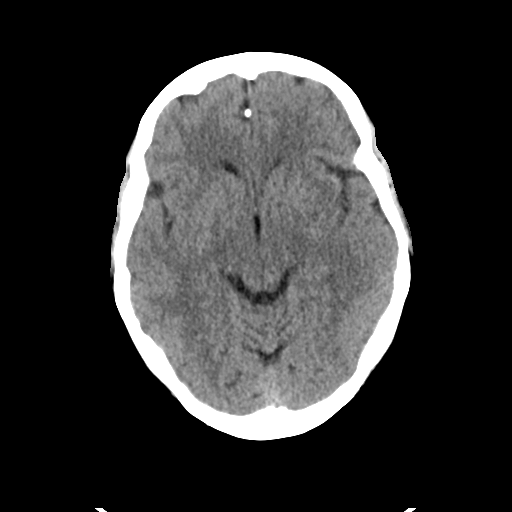
[im 15/29  brain]
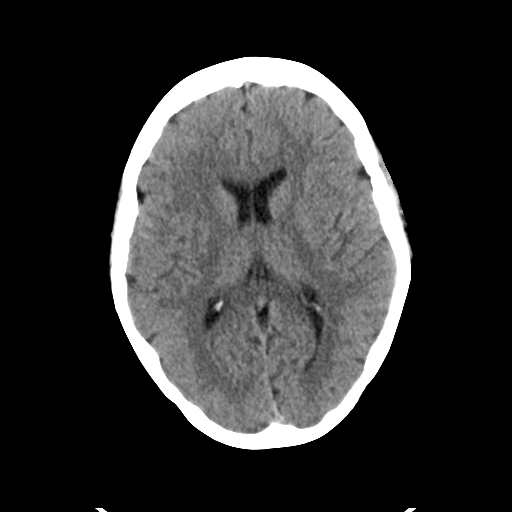
[im 15/29  bone]
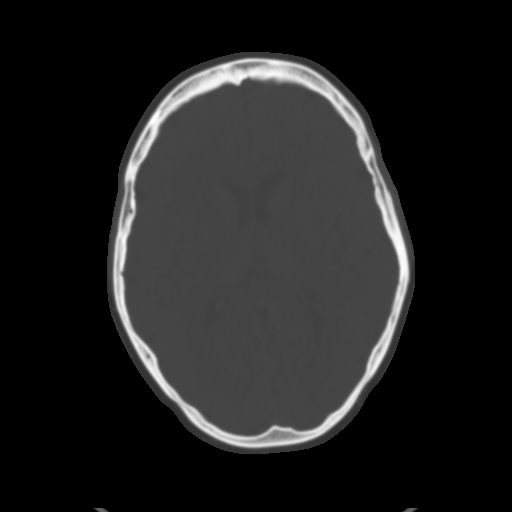
[im 18/29  brain]
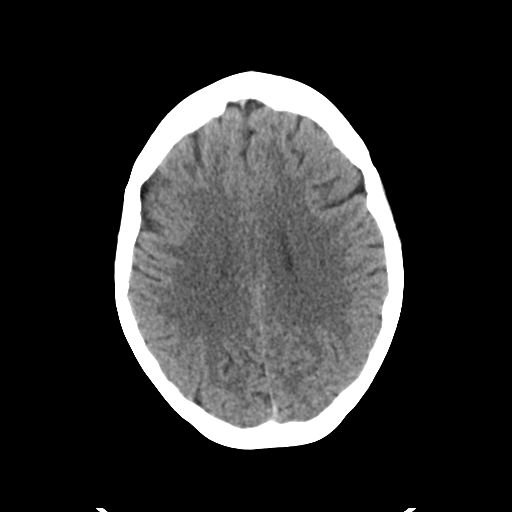
[im 21/29  brain]
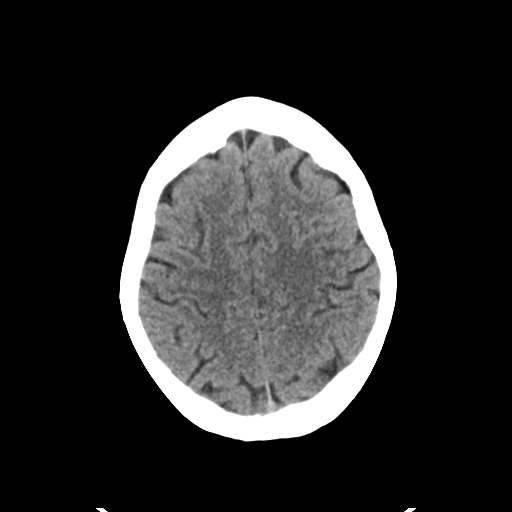
[im 24/29  brain]
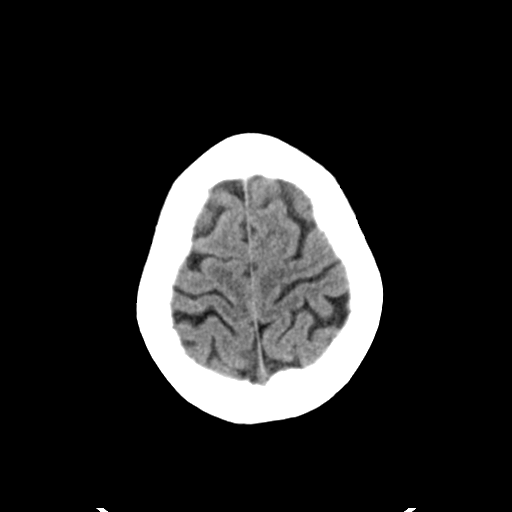
[im 27/29  brain]
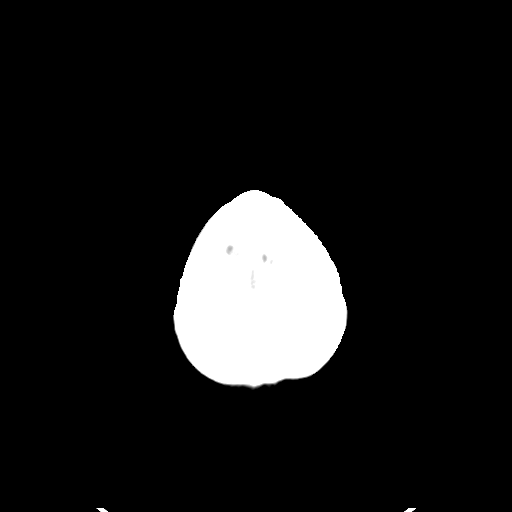
[im 27/29  bone]
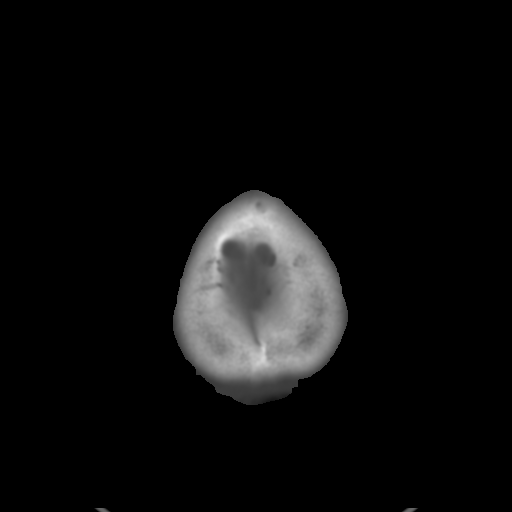

[Series 4: coronal soft tissue · coronal · 0.35mm/px · 3 of 66 slices shown]
[im 22/66  brain]
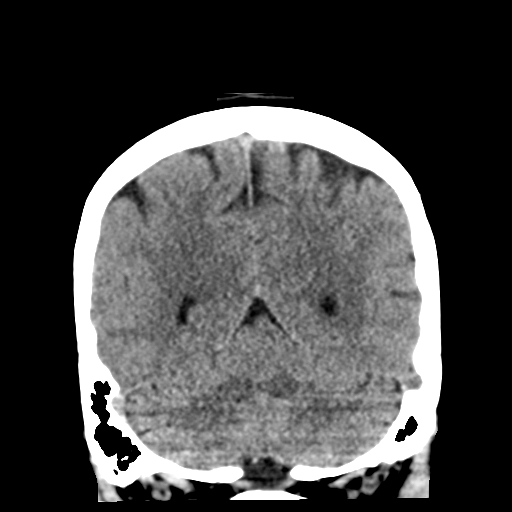
[im 29/66  brain]
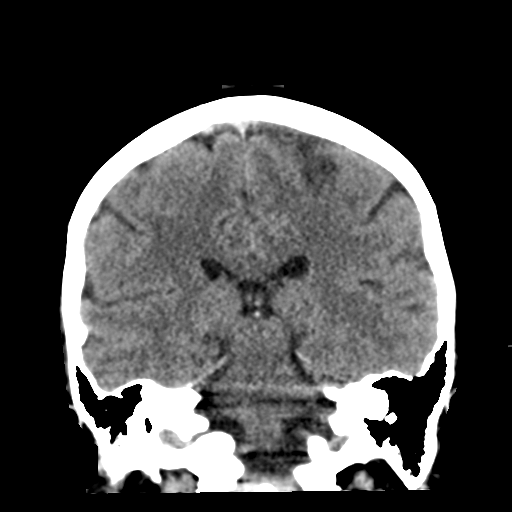
[im 37/66  brain]
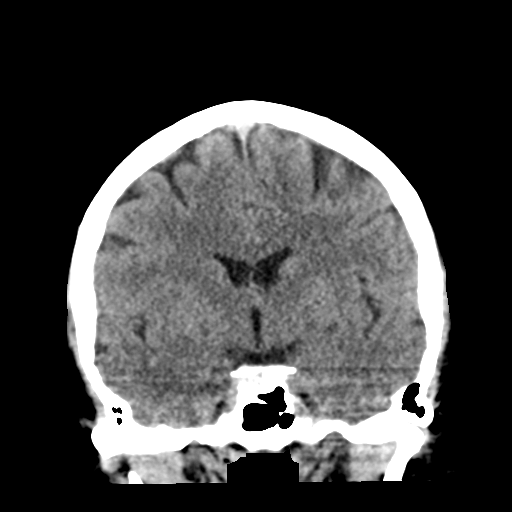

[Series 5: sagittal soft tissue · sagittal · 0.33mm/px · 3 of 67 slices shown]
[im 23/67  brain]
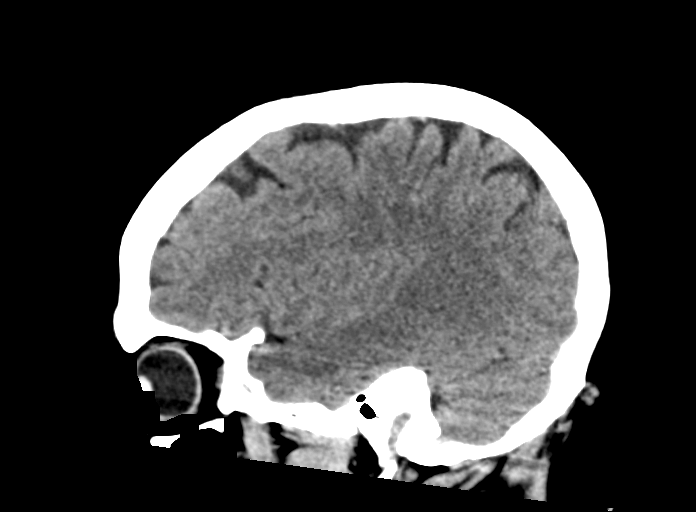
[im 34/67  brain]
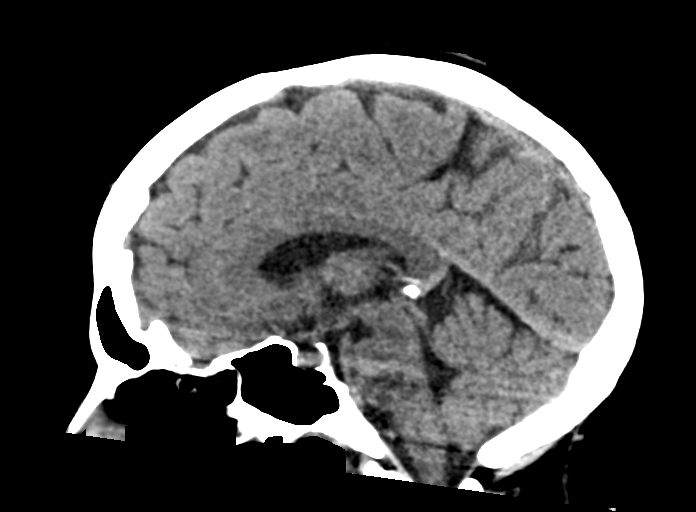
[im 45/67  brain]
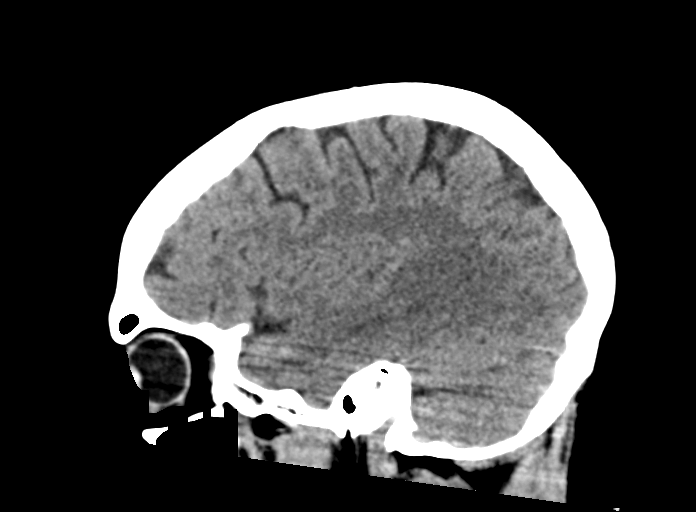

[15 of 46 positions shown; findings below may reference images not displayed]

FINDINGS: Brain: No evidence of acute infarction, hemorrhage, hydrocephalus,
extra-axial collection or mass lesion/mass effect.

The posterior fossa, including the cerebellum, brainstem and fourth
ventricle, is within normal limits. The third and lateral
ventricles, and basal ganglia are unremarkable in appearance. The
cerebral hemispheres are symmetric in appearance, with normal
gray-white differentiation. No mass effect or midline shift is seen.

Vascular: No hyperdense vessel or unexpected calcification.

Skull: There is no evidence of fracture; visualized osseous
structures are unremarkable in appearance.

Sinuses/Orbits: The orbits are within normal limits. The paranasal
sinuses and mastoid air cells are well-aerated.

Other: No significant soft tissue abnormalities are seen.
IMPRESSION: Unremarkable noncontrast CT of the head.

## 2018-04-12 ENCOUNTER — Other Ambulatory Visit: Payer: Self-pay | Admitting: Adult Health

## 2018-10-28 ENCOUNTER — Other Ambulatory Visit: Payer: Self-pay

## 2018-10-28 DIAGNOSIS — Z20822 Contact with and (suspected) exposure to covid-19: Secondary | ICD-10-CM

## 2018-10-29 LAB — NOVEL CORONAVIRUS, NAA: SARS-CoV-2, NAA: NOT DETECTED

## 2018-11-26 ENCOUNTER — Other Ambulatory Visit: Payer: Self-pay

## 2018-11-26 DIAGNOSIS — Z20822 Contact with and (suspected) exposure to covid-19: Secondary | ICD-10-CM

## 2018-11-28 LAB — NOVEL CORONAVIRUS, NAA: SARS-CoV-2, NAA: NOT DETECTED

## 2018-12-28 DEATH — deceased
